# Patient Record
Sex: Female | Born: 2000 | Race: Black or African American | Hispanic: No | Marital: Single | Smoking: Never smoker
Health system: Southern US, Community
[De-identification: ages and names within clinical notes are randomized; demographics above are authoritative.]

## PROBLEM LIST (undated history)

## (undated) DIAGNOSIS — Z9109 Other allergy status, other than to drugs and biological substances: Secondary | ICD-10-CM

---
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
## 2021-08-01 ENCOUNTER — Other Ambulatory Visit: Payer: Self-pay

## 2021-08-01 ENCOUNTER — Emergency Department (HOSPITAL_COMMUNITY)
Admission: EM | Admit: 2021-08-01 | Discharge: 2021-08-02 | Disposition: A | Discharge status: Home / Self Care | Payer: Self-pay | Attending: Emergency Medicine | Admitting: Emergency Medicine

## 2021-08-01 ENCOUNTER — Emergency Department (HOSPITAL_COMMUNITY): Payer: Self-pay

## 2021-08-01 DIAGNOSIS — M25511 Pain in right shoulder: Secondary | ICD-10-CM | POA: Insufficient documentation

## 2021-08-01 LAB — I-STAT BETA HCG BLOOD, ED (MC, WL, AP ONLY): I-stat hCG, quantitative: <5 m[IU]/mL (ref <5)

## 2021-08-01 NOTE — ED Triage Notes (Signed)
Pt c/o right shoulder pain. States reaching down to pick up shoes when she heard her shoulder pop.

## 2021-08-01 NOTE — ED Notes (Signed)
Shoulder immobilizer applied in triage. No obvious deformity

## 2021-08-02 NOTE — Discharge Instructions (Addendum)
Thank you for allowing me to care for you today in the Emergency Department.   As we discussed, your x-ray today was reassuring.  Take 650 mg of Tylenol or 600 mg of ibuprofen with food every 6 hours for pain.  You can alternate between these 2 medications every 3 hours if your pain returns.  For instance, you can take Tylenol at noon, followed by a dose of ibuprofen at 3, followed by second dose of Tylenol and 6.  ER apply ice pack or heating pad for 15 to 20 minutes up to 3-4 times a day over the next 3 to 5 days up with your symptoms.  I would avoid wearing the sling anymore than absolutely necessary as this can put you at risk for frozen shoulder.  Try to gently stretch the muscles of your shoulder, upper chest, and upper back as your pain allows.  Call the number on your discharge paperwork to get established with a primary care provider.  Return to the emergency department if you have worsening pain with shortness of breath, new numbness or weakness, if your fingers turn blue, if you are shoulder pops out of joints, or you have other new, concerning symptoms.

## 2021-08-02 NOTE — ED Provider Notes (Signed)
[facility] EMERGENCY DEPARTMENT Provider Note   CSN: [id number] Arrival date & time: 08/01/21  1931     History Chief Complaint  Patient presents with   Shoulder Pain    Tracy Lewis is a 20 y.o. female who presents the emergency department with a chief complaint of right shoulder pain.  The patient reports that earlier tonight that she was reaching down to pick up a pair shoes when she felt a pop in her right shoulder accompanied by sudden onset of pain.  Pain is nonradiating.  She is unable to characterize the pain.  It is worse with some movements.  She denies numbness, weakness, neck pain, fever, chills, elbow pain, redness, warmth, swelling, wounds.  No treatment prior to arrival.  Her mother also adds that she is currently in college student and he stated established with a PCP.  She has been more fatigued for several weeks.  No night sweats, weight loss, history of previous right shoulder injuries or surgeries, previous joint dislocations, fever, chills, vomiting, diarrhea, sore throat, abdominal pain, URI symptoms.  The history is provided by the patient and medical records. No language interpreter was used.      Past Medical History:  Diagnosis Date   Environmental allergies     There are no problems to display for this patient.   No past surgical history on file.   OB History   No obstetric history on file.     No family history on file.  Social History   Tobacco Use   Smoking status: Never   Smokeless tobacco: Never    Home Medications Prior to Admission medications   Medication Sig Start Date End Date Taking? Authorizing Provider  Multiple Vitamin (MULTIVITAMIN WITH MINERALS) TABS tablet Take 1 tablet by mouth daily.    [provider]    Allergies    Patient has no known allergies.  Review of Systems   Review of Systems  Constitutional:  Positive for fatigue. Negative for activity change, chills, diaphoresis and fever.   HENT:  Negative for congestion.   Eyes:  Negative for visual disturbance.  Respiratory:  Negative for shortness of breath and wheezing.   Cardiovascular:  Negative for chest pain.  Gastrointestinal:  Negative for abdominal pain, diarrhea and vomiting.  Musculoskeletal:  Positive for arthralgias and myalgias. Negative for back pain, gait problem, joint swelling, neck pain and neck stiffness.  Skin:  Negative for color change, rash and wound.  Allergic/Immunologic: Negative for immunocompromised state.  Neurological:  Negative for seizures, syncope, weakness, light-headedness and numbness.   Physical Exam Updated Vital Signs BP 103/66 (BP Location: Right Arm)   Pulse 79   Temp (!) 97.5 F (36.4 C) (Oral)   Resp 16   Ht 5' 8.5" (1.74 m)   Wt 52.2 kg   SpO2 100%   BMI 17.23 kg/m   Physical Exam Vitals and nursing note reviewed.  Constitutional:      General: She is not in acute distress.    Appearance: She is not ill-appearing, toxic-appearing or diaphoretic.  HENT:     Head: Normocephalic.  Eyes:     Conjunctiva/sclera: Conjunctivae normal.  Cardiovascular:     Rate and Rhythm: Normal rate and regular rhythm.     Heart sounds: No murmur heard.   No friction rub. No gallop.  Pulmonary:     Effort: Pulmonary effort is normal. No respiratory distress.     Breath sounds: No stridor. No wheezing, rhonchi or rales.  Chest:     Chest wall: No tenderness.  Abdominal:     General: There is no distension.     Palpations: Abdomen is soft.  Musculoskeletal:     Cervical back: Neck supple.     Comments: Mild tenderness palpation to the right acromion.  Spine is nontender without crepitus or step-offs.  No tenderness to the right clavicle.  Normal exam of the right elbow and wrist.  Neurovascular intact to the bilateral upper extremities.  Ribs are nontender.  No erythema, edema, warmth, rash, wounds.  Muscular compartments of the right upper extremity are soft.  Skin:    General:  Skin is warm.     Findings: No rash.  Neurological:     Mental Status: She is alert.  Psychiatric:        Behavior: Behavior normal.    ED Results / Procedures / Treatments   Labs (all labs ordered are listed, but only abnormal results are displayed) Labs Reviewed  I-STAT BETA HCG BLOOD, ED ([facility], [facility], [facility] ONLY)    EKG None  Radiology DG Shoulder Right  Result Date: 08/01/2021 CLINICAL DATA:  Injury. EXAM: RIGHT SHOULDER - 2+ VIEW COMPARISON:  None. FINDINGS: There is no evidence of fracture or dislocation. There is no evidence of arthropathy or other focal bone abnormality. Soft tissues are unremarkable. IMPRESSION: Negative. Electronically Signed   By: Darliss Cheney M.D.   On: 08/01/2021 22:27    Procedures Procedures   Medications Ordered in ED Medications - No data to display  ED Course  I have reviewed the triage vital signs and the nursing notes.  Pertinent labs & imaging results that were available during my care of the patient were reviewed by me and considered in my medical decision making (see chart for details).    MDM Rules/Calculators/A&P                           Patient X-Ray negative for obvious fracture or dislocation.  Physical exam is overall reassuring.  Pt advised to follow up with primary care if symptoms persist for possibility of missed fracture diagnosis. Patient given brace while in ED, but I discussed that sling should minimally be used since it can increase her risk of frozen shoulder.  Conservative therapy recommended and discussed.  I have a low suspicion for septic joint, gout, occult fracture, cervical radiculopathy, upper extremity DVT, compartment syndrome. Patient will be dc home & is agreeable with above plan.   Final Clinical Impression(s) / ED Diagnoses Final diagnoses:  Acute pain of right shoulder    Rx / DC Orders ED Discharge Orders     None        Yukio Bisping A, PA-C 08/02/21 0259    Melene Plan, DO 08/02/21 2992

## 2022-05-25 IMAGING — DX DG SHOULDER 2+V*R*
3 series · 3 of 3 positions shown · non-contrast
Comparison: None.

CLINICAL DATA: Injury.

EXAM:
RIGHT SHOULDER - 2+ VIEW

[shoulder grashey]
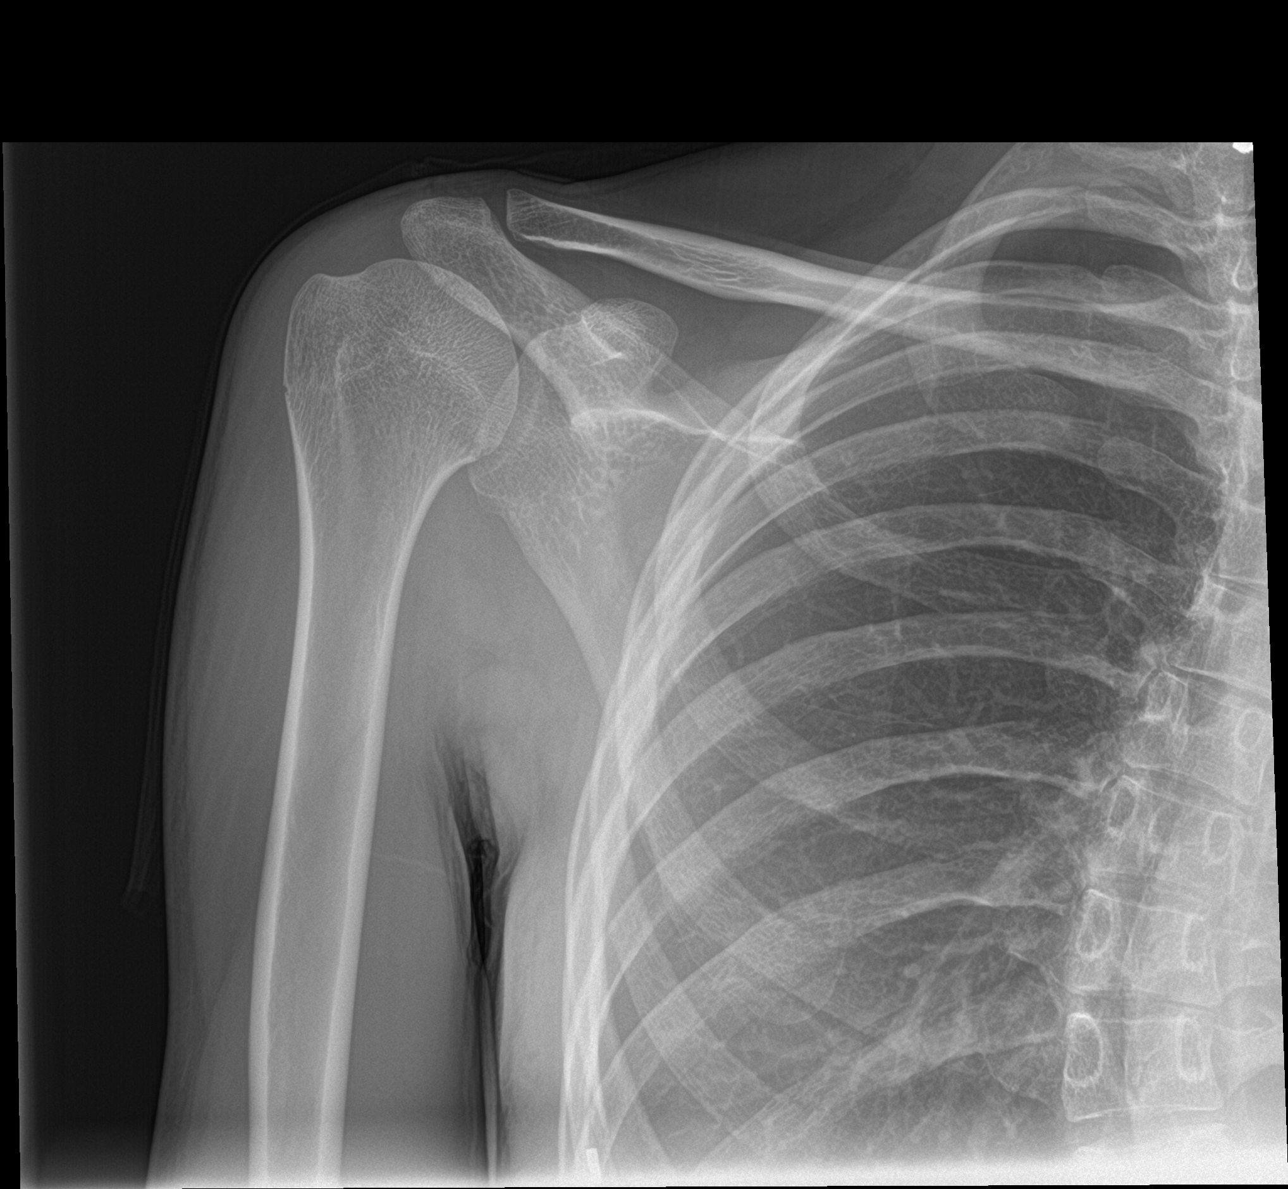

[shoulder y view]
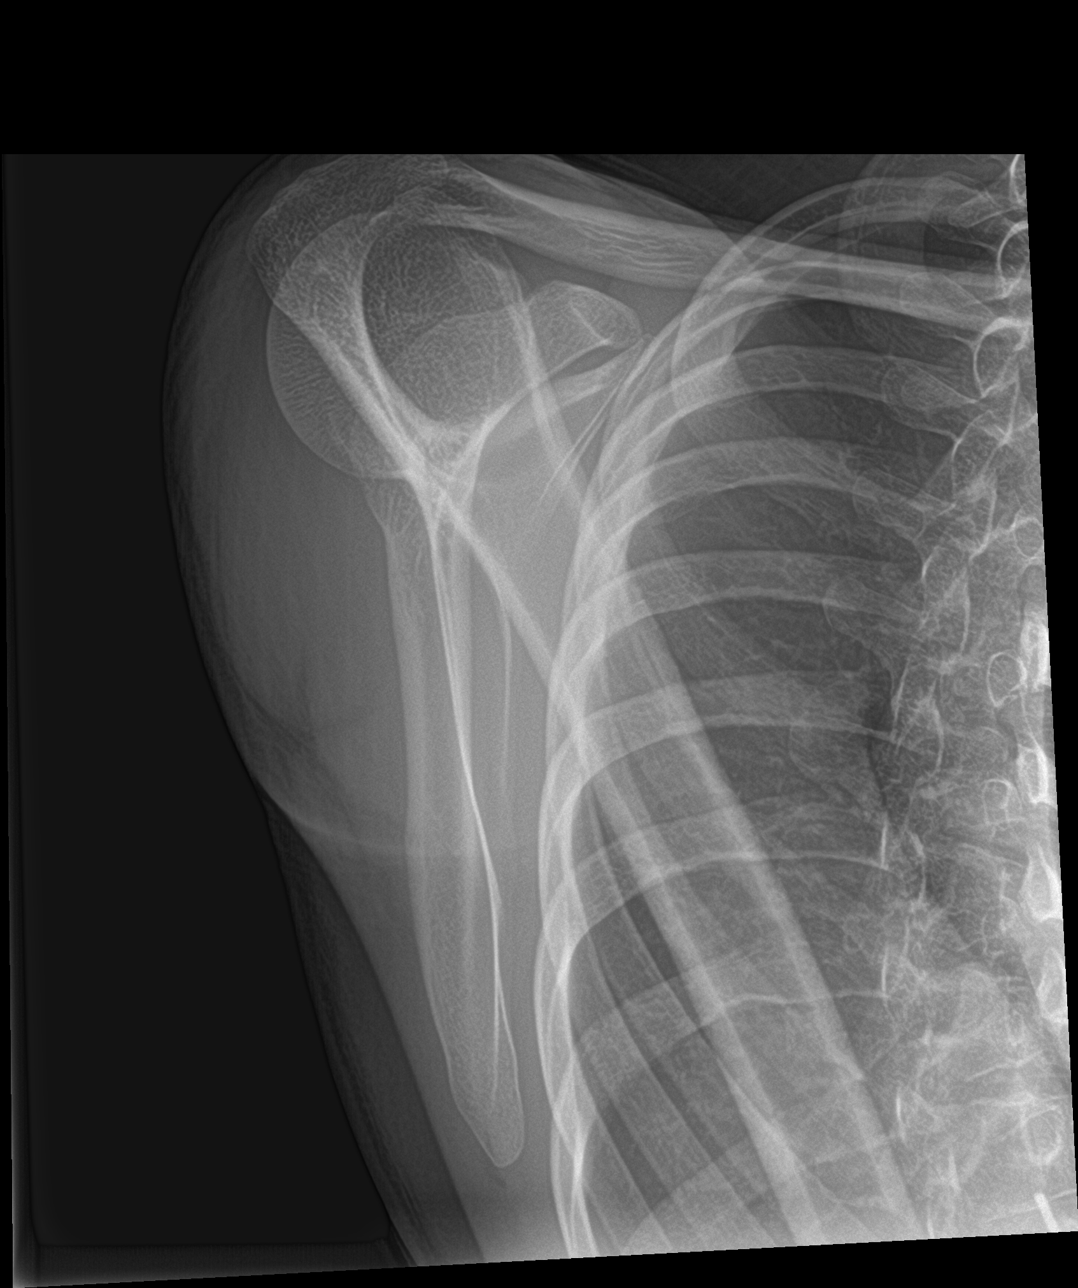

[shoulder ap neutral]
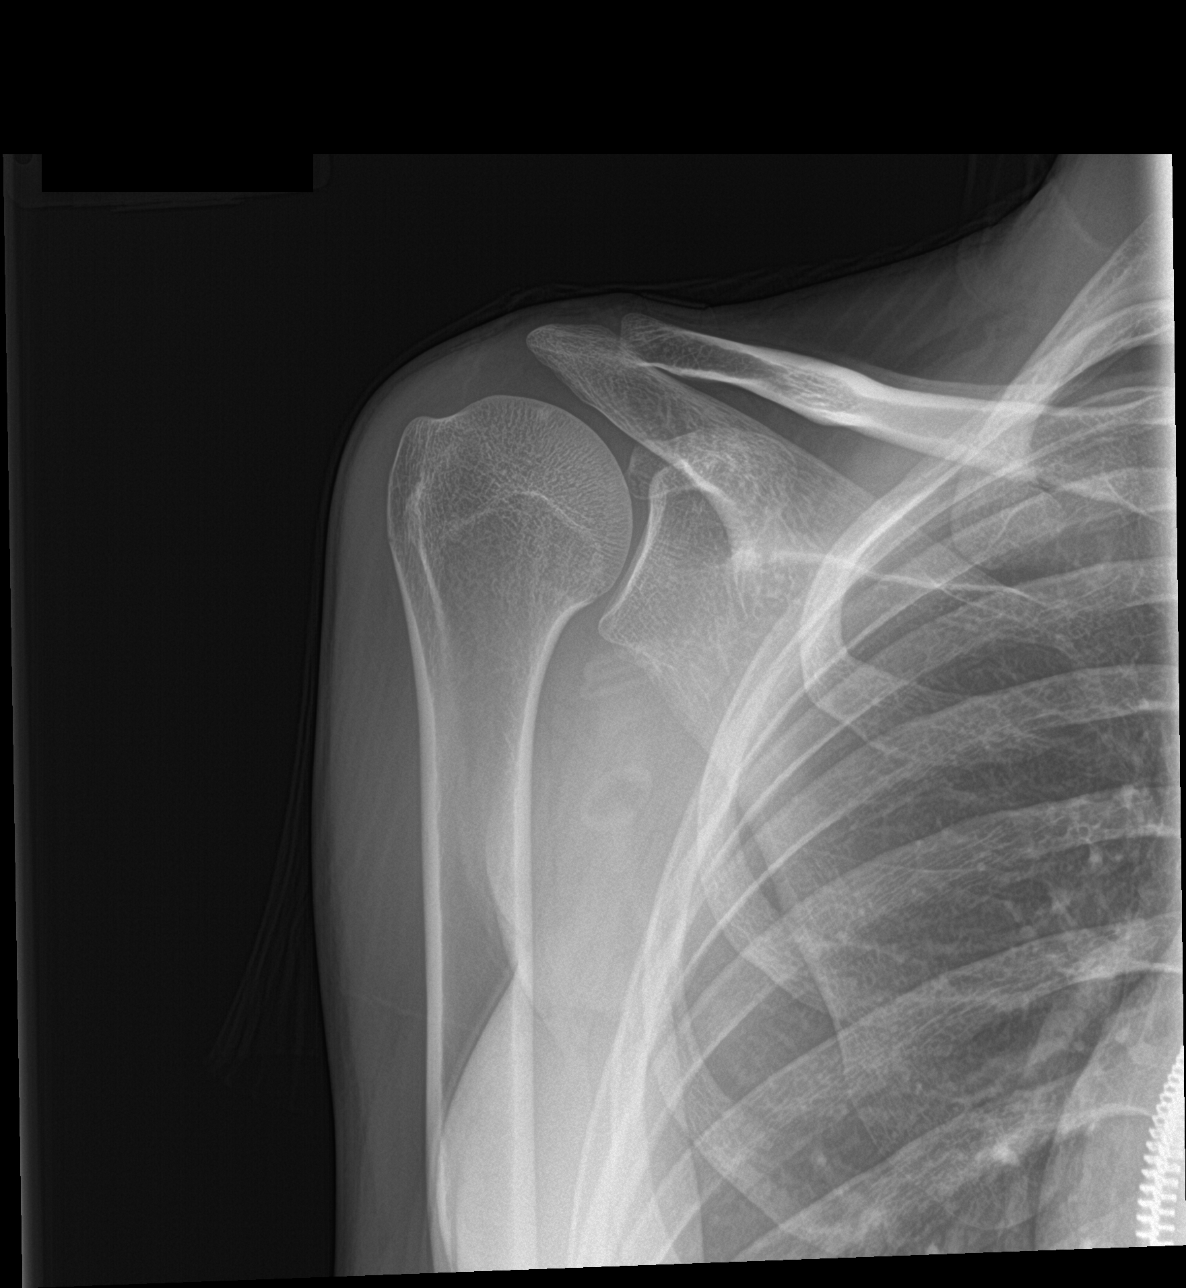

[3 of 3 positions shown; findings below may reference images not displayed]

FINDINGS: There is no evidence of fracture or dislocation. There is no
evidence of arthropathy or other focal bone abnormality. Soft
tissues are unremarkable.
IMPRESSION: Negative.
